# Patient Record
Sex: Male | Born: 1990 | Race: White | Hispanic: No | Marital: Single | Smoking: Current every day smoker
Health system: Southern US, Community
[De-identification: ages and names within clinical notes are randomized; demographics above are authoritative.]

## PROBLEM LIST (undated history)

## (undated) DIAGNOSIS — N4830 Priapism, unspecified: Secondary | ICD-10-CM

## (undated) HISTORY — PX: ABDOMINAL SURGERY: SHX537

---

## 2019-11-06 ENCOUNTER — Encounter (HOSPITAL_COMMUNITY): Payer: Self-pay | Admitting: Emergency Medicine

## 2019-11-06 ENCOUNTER — Emergency Department (HOSPITAL_COMMUNITY)
Admission: EM | Admit: 2019-11-06 | Discharge: 2019-11-08 | Disposition: A | Discharge status: Home / Self Care | Payer: Medicaid - Out of State | Attending: Emergency Medicine | Admitting: Emergency Medicine

## 2019-11-06 ENCOUNTER — Emergency Department (HOSPITAL_COMMUNITY): Payer: Medicaid - Out of State

## 2019-11-06 ENCOUNTER — Other Ambulatory Visit: Payer: Self-pay

## 2019-11-06 DIAGNOSIS — Z590 Homelessness: Secondary | ICD-10-CM | POA: Diagnosis not present

## 2019-11-06 DIAGNOSIS — F329 Major depressive disorder, single episode, unspecified: Secondary | ICD-10-CM | POA: Diagnosis present

## 2019-11-06 DIAGNOSIS — Z20828 Contact with and (suspected) exposure to other viral communicable diseases: Secondary | ICD-10-CM | POA: Insufficient documentation

## 2019-11-06 DIAGNOSIS — R45851 Suicidal ideations: Secondary | ICD-10-CM | POA: Insufficient documentation

## 2019-11-06 DIAGNOSIS — F332 Major depressive disorder, recurrent severe without psychotic features: Secondary | ICD-10-CM | POA: Insufficient documentation

## 2019-11-06 DIAGNOSIS — Z23 Encounter for immunization: Secondary | ICD-10-CM | POA: Insufficient documentation

## 2019-11-06 DIAGNOSIS — F17210 Nicotine dependence, cigarettes, uncomplicated: Secondary | ICD-10-CM | POA: Diagnosis not present

## 2019-11-06 HISTORY — DX: Priapism, unspecified: N48.30

## 2019-11-06 LAB — COMPREHENSIVE METABOLIC PANEL
ALT: 21 U/L (ref 0–44)
AST: 30 U/L (ref 15–41)
Albumin: 4 g/dL (ref 3.5–5.0)
Alkaline Phosphatase: 96 U/L (ref 38–126)
Anion gap: 12 (ref 5–15)
BUN: 16 mg/dL (ref 6–20)
CO2: 21 mmol/L — ABNORMAL LOW (ref 22–32)
Calcium: 9.2 mg/dL (ref 8.9–10.3)
Chloride: 100 mmol/L (ref 98–111)
Creatinine, Ser: 1.01 mg/dL (ref 0.61–1.24)
GFR calc Af Amer: >60 mL/min (ref >60)
GFR calc non Af Amer: >60 mL/min (ref >60)
Glucose, Bld: 109 mg/dL — ABNORMAL HIGH (ref 70–99)
Potassium: 4.1 mmol/L (ref 3.5–5.1)
Sodium: 133 mmol/L — ABNORMAL LOW (ref 135–145)
Total Bilirubin: 1.6 mg/dL — ABNORMAL HIGH (ref 0.3–1.2)
Total Protein: 6.7 g/dL (ref 6.5–8.1)

## 2019-11-06 LAB — CBC
HCT: 53 % — ABNORMAL HIGH (ref 39.0–52.0)
Hemoglobin: 18.4 g/dL — ABNORMAL HIGH (ref 13.0–17.0)
MCH: 32.8 pg (ref 26.0–34.0)
MCHC: 34.7 g/dL (ref 30.0–36.0)
MCV: 94.5 fL (ref 80.0–100.0)
Platelets: 248 10*3/uL (ref 150–400)
RBC: 5.61 MIL/uL (ref 4.22–5.81)
RDW: 13.3 % (ref 11.5–15.5)
WBC: 9.7 10*3/uL (ref 4.0–10.5)
nRBC: 0 % (ref 0.0–0.2)

## 2019-11-06 LAB — ETHANOL: Alcohol, Ethyl (B): <10 mg/dL (ref <10)

## 2019-11-06 LAB — ACETAMINOPHEN LEVEL: Acetaminophen (Tylenol), Serum: <10 ug/mL — ABNORMAL LOW (ref 10–30)

## 2019-11-06 LAB — SALICYLATE LEVEL: Salicylate Lvl: <7 mg/dL — ABNORMAL LOW (ref 7.0–30.0)

## 2019-11-06 MED ORDER — TETANUS-DIPHTH-ACELL PERTUSSIS 5-2.5-18.5 LF-MCG/0.5 IM SUSP
0.5000 mL | Freq: Once | INTRAMUSCULAR | Status: AC
  Administered 2019-11-06: 0.5 mL via INTRAMUSCULAR
  Filled 2019-11-06: qty 0.5

## 2019-11-06 NOTE — ED Notes (Signed)
TTS in progress 

## 2019-11-06 NOTE — ED Triage Notes (Signed)
Pt here for evaluation of suicidal ideation, under a lot of stress related to family problems, so he picked up some broken beer cans [place] the [place] and cut bilateral thighs. Pt sts she was trying to injure himself as much as possible so was repeatedly hitting himself in the chest and now has some chest wall pain. Pt supposed to be taking vistaril, but hasn't taken any in two months. From [place], here visiting/is homless d/t parents kicking him out d/t suicide attempts and being gay.

## 2019-11-06 NOTE — ED Provider Notes (Signed)
[facility] EMERGENCY DEPARTMENT Provider Note   CSN: [id number] Arrival date & time: 11/06/19  1636     History Chief Complaint  Patient presents with  . Suicidal    Devin Knight is a 28 y.o. male.  Patient presents to ED for evaluation of suicidal ideation. He recently had a argument with his mother, who kicked him out of the house due to admission of being gay and cross dressing. He has been homeless for two days. He states he picked up some glass from a broken beer bottle and attempted to cut his legs. He has very superficial wounds to the lower aspect of both thighs. He also endorses repeatedly hitting himself in the chest with his fists, and reports anterior chest wall pain.   Mental Health Problem Presenting symptoms: suicidal thoughts   Degree of incapacity (severity):  Moderate Onset quality:  Sudden Duration:  2 days Timing:  Constant Associated symptoms: feelings of worthlessness        Past Medical History:  Diagnosis Date  . Priapism     There are no problems to display for this patient.   Past Surgical History:  Procedure Laterality Date  . ABDOMINAL SURGERY         No family history on file.  Social History   Tobacco Use  . Smoking status: Current Every Day Smoker    Packs/day: 2.00    Types: Cigarettes  . Smokeless tobacco: Never Used  Substance Use Topics  . Alcohol use: Yes  . Drug use: Not on file    Home Medications Prior to Admission medications   Not on File    Allergies    Patient has no known allergies.  Review of Systems   Review of Systems  Skin: Positive for wound.  Psychiatric/Behavioral: Positive for suicidal ideas.  All other systems reviewed and are negative.   Physical Exam Updated Vital Signs BP (!) 144/95 (BP Location: Left Arm)   Pulse (!) 115   Temp 98 F (36.7 C) (Oral)   Resp 20   SpO2 97%   Physical Exam Constitutional:      Appearance: He is not ill-appearing.  HENT:   Head: Atraumatic.     Mouth/Throat:     Mouth: Mucous membranes are moist.  Eyes:     Conjunctiva/sclera: Conjunctivae normal.  Cardiovascular:     Rate and Rhythm: Normal rate and regular rhythm.  Pulmonary:     Effort: Pulmonary effort is normal.     Breath sounds: Normal breath sounds.  Abdominal:     Palpations: Abdomen is soft.  Musculoskeletal:        General: Normal range of motion.     Cervical back: Neck supple.  Skin:    General: Skin is warm and dry.  Neurological:     Mental Status: He is alert and oriented to person, place, and time.  Psychiatric:        Behavior: Behavior is cooperative.        Thought Content: Thought content includes suicidal ideation.        Cognition and Memory: Cognition and memory normal.     ED Results / Procedures / Treatments   Labs (all labs ordered are listed, but only abnormal results are displayed) Labs Reviewed  COMPREHENSIVE METABOLIC PANEL - Abnormal; Notable for the following components:      Result Value   Sodium 133 (*)    CO2 21 (*)    Glucose, Bld 109 (*)  Total Bilirubin 1.6 (*)    All other components within normal limits  SALICYLATE LEVEL - Abnormal; Notable for the following components:   Salicylate Lvl <7.0 (*)    All other components within normal limits  ACETAMINOPHEN LEVEL - Abnormal; Notable for the following components:   Acetaminophen (Tylenol), Serum <10 (*)    All other components within normal limits  CBC - Abnormal; Notable for the following components:   Hemoglobin 18.4 (*)    HCT 53.0 (*)    All other components within normal limits  SARS CORONAVIRUS 2 (TAT 6-24 HRS)  ETHANOL  RAPID URINE DRUG SCREEN, HOSP PERFORMED    EKG None  Radiology DG Chest 2 View  Result Date: 11/06/2019 CLINICAL DATA:  Chest wall/midsternal pain EXAM: CHEST - 2 VIEW COMPARISON:  None. FINDINGS: Some increased hazy interstitial opacity is seen towards the lung bases without focal consolidative airspace disease. No  convincing features of edema. Slight hyperinflation of the lungs with flattening of the diaphragms. No pneumothorax or effusion. The cardiomediastinal contours are unremarkable. No acute osseous or soft tissue abnormality. IMPRESSION: Increased hazy interstitial opacity towards the lung bases without focal consolidative airspace disease. Findings are favored to reflect atelectasis though early atypical infection could have a similar appearance. Electronically Signed   By: Kreg Shropshire M.D.   On: 11/06/2019 20:23    Procedures Procedures (including critical care time)  Medications Ordered in ED Medications - No data to display  ED Course  I have reviewed the triage vital signs and the nursing notes.  Pertinent labs & imaging results that were available during my care of the patient were reviewed by me and considered in my medical decision making (see chart for details).    MDM Rules/Calculators/A&P                      Labs and radiology results reviewed. Patient does not exhibit any respiratory symptoms. Doubt infection. Patient cleared for TTS consult for evaluation of suicidal thoughts. Patient has been placed in psych hold and moved to Rm 50 pending completion of consult. Final Clinical Impression(s) / ED Diagnoses Final diagnoses:  Suicidal ideation    Rx / DC Orders ED Discharge Orders    None       Felicie Morn, NP 11/06/19 2145    Mancel Bale, MD 11/09/19 [phone]

## 2019-11-06 NOTE — ED Notes (Signed)
Patient transported to X-ray 

## 2019-11-06 NOTE — ED Notes (Signed)
Pt belongings in locker 5.

## 2019-11-07 LAB — SARS CORONAVIRUS 2 (TAT 6-24 HRS): SARS Coronavirus 2: NEGATIVE

## 2019-11-07 LAB — RAPID URINE DRUG SCREEN, HOSP PERFORMED
Amphetamines: NOT DETECTED
Barbiturates: NOT DETECTED
Benzodiazepines: NOT DETECTED
Cocaine: NOT DETECTED
Opiates: NOT DETECTED
Tetrahydrocannabinol: POSITIVE — AB

## 2019-11-07 MED ORDER — LORAZEPAM 1 MG PO TABS
1.0000 mg | ORAL_TABLET | Freq: Four times a day (QID) | ORAL | Status: DC | PRN
  Administered 2019-11-07: 1 mg via ORAL
  Filled 2019-11-07: qty 1

## 2019-11-07 NOTE — ED Provider Notes (Signed)
Emergency Medicine Observation Re-evaluation Note  Devin Knight is a 28 y.o. male, seen on rounds today.  Pt initially presented to the ED for complaints of Suicidal Currently, the patient is resting comfortably. No complaints overnight.  Physical Exam  BP 126/77 (BP Location: Left Leg)   Pulse 85   Temp 98.1 F (36.7 C) (Oral)   Resp 18   SpO2 95%  Physical Exam  PE: Constitutional: well-developed, well-nourished, no apparent distress HENT: normocephalic, atraumatic.  Cardiovascular: normal rate and rhythm Pulmonary/Chest: effort normal; breath sounds clear and equal bilaterally; no wheezes or rales Abdominal: non distended Musculoskeletal: full ROM, no edema Neurological: alert  Skin: warm and dry, no rash, no diaphoresis Psychiatric: + suicidal ideations and auditory hallucinations   ED Course / MDM  EKG:    I have reviewed the labs performed to date as well as medications administered while in observation.  Recent changes in the last 24 hours include none. Plan  Current plan is for inpatient psych treatment. Patient is not under full IVC at this time.   Flint Melter 11/07/19 1908    Drenda Freeze, MD 11/08/19 0800

## 2019-11-07 NOTE — ED Notes (Signed)
Patient was Given a snack and drink. A Regular Diet was ordered for Lunch.

## 2019-11-07 NOTE — ED Notes (Signed)
Called to pts room, pt states that he is hearing voices and he is talking to himself. Pt asking if he can have something for anxiety and for sleep tonight. Will speak with the md.

## 2019-11-07 NOTE — ED Notes (Signed)
The pt is asking for his medicines  He has none ordered  Nothing is listed in his current chart

## 2019-11-07 NOTE — ED Notes (Addendum)
Pt to purple zone, dressed in purple scrubs. Pt c/o pain to his chest from hitting it with his fists. "I think I broke my ribs". Pt in no distress. Resp even and non-labored. Pt aware that we need a urine sample.

## 2019-11-07 NOTE — ED Notes (Signed)
Pt given snack and drink 

## 2019-11-07 NOTE — ED Notes (Signed)
Breakfast tray ordered 

## 2019-11-07 NOTE — BH Assessment (Signed)
[facility] Assessment Progress Note   Clinician informed Dr. Gustavus Messing of patient's disposition (inpatient).  He will let PA Quincy Carnes know.

## 2019-11-07 NOTE — ED Notes (Signed)
Called nutrition @1900  and they stated this patient received dinner tray at 1815

## 2019-11-07 NOTE — BHH Counselor (Signed)
Pt was reassessed this AM.  He reported that he continues to feel badly, stating that he is suicidal (currently without fixed plan -- ''whatever I can find'') and auditory hallucinations (voices encouraging him to kill himself and others).  Noted to client that he was interested in seeking treatment in [place].  Client stated that he had treatment in [place], but he is interested in getting treatment in [place] first.  He stated that if discharged, he might harm himself.  Pt stated also that once he is discharged, he will move to [place].  He stated that he has support in the community who can transport him there.  Client declined an opportunity to leave the hospital and go to [place] now.  Pt continues to meet inpatient criteria.

## 2019-11-07 NOTE — BH Assessment (Signed)
Tele Assessment Note   Patient Name: Devin Knight MRN: [id number] Referring Physician: Felicie Morn, NP Location of Patient: [facility] Location of Provider: Behavioral Health TTS Department  Tanya Marvin is an 28 y.o. male.  -Clinician reviewed note by Felicie Morn, NP.  Patient presents to ED for evaluation of suicidal ideation. He recently had a argument with his mother, who kicked him out of the house due to admission of being gay and cross dressing. He has been homeless for two days. He states he picked up some glass from a broken beer bottle and attempted to cut his legs. He has very superficial wounds to the lower aspect of both thighs. He also endorses repeatedly hitting himself in the chest with his fists, and reports anterior chest wall pain.  Patient said that he had come out as gay to his mother and two sisters.  He said that mother told him to get out of the house.  Patient says that he has been essentially homeless for the last 2-4 days.  At first he had said it was 2 days then it was 2-4 days.  His sisters do not return calls and he has not tried to call his mother back.  Patient says he broke his phone.    Patient did self harm by cutting his legs and hitting himself on the chest repeatedly.  Patient endorses SI with a plan to cut himself.  Patient has had two previous suicide attempts.  Pt says he has had some thoughts of harm to others but no HI.  He is not specific about wanting to harm other people.  No hx of getting into fights (per patient).    Patient says he hears voices telling him to kill himself.  Patient sees things and only said that he saw "couds and light."  He denies any use of illicit drugs and says he had a beer a few days ago.  Patient says that he lives primarily in [place].  His father died about a year ago.  Pt is unclear about how long he has been staying with his mother in [place].  He says that he had a job in [place] and friends there.    Patient identifies as  both male and male.  He does say he cross dresses.  He prefers to be called [name] or [name]."  Patient is alert and oriented x3.  He has fair eye contact.  His demeanor is congruent w/ being depressed.  Pt does not show signs of responding to internal stimuli at this time.  Pt thought process is relevant.  He is getting <4H/D of sleep due to homelessness.  Pt reports his appetite is good.  Patient says he has had inpatient care in [place] at a facility in [facility]. [place].  He has no current outpatient care.  -Clinician discussed patient care with Renaye Rakers, NP who recommended inpatient care.  AC Fransico Michael said that [facility] has no appropriate beds at this time.  TTS to seek placement.  Diagnosis: F33.2 MDD recurrent, severe  Past Medical History:  Past Medical History:  Diagnosis Date  . Priapism     Past Surgical History:  Procedure Laterality Date  . ABDOMINAL SURGERY      Family History: No family history on file.  Social History:  reports that he has been smoking cigarettes. He has been smoking about 2.00 packs per day. He has never used smokeless tobacco. He reports current alcohol use. No history on file for drug.  Additional Social History:  Alcohol / Drug Use Pain Medications: None Prescriptions: None Over the Counter: None History of alcohol / drug use?: No history of alcohol / drug abuse  CIWA: CIWA-Ar BP: 136/61 Pulse Rate: (!) 101 COWS:    Allergies: No Known Allergies  Home Medications: (Not in a hospital admission)   OB/GYN Status:  No LMP for male patient.  General Assessment Data [facility] Assessment: In system Is this a Tele or Face-to-Face Assessment?: Tele Assessment Is this an Initial Assessment or a Re-assessment for this encounter?: Initial Assessment Patient Accompanied by:: N/A Language Other than English: No Living Arrangements: Homeless/Shelter(Pt homeless for last two to four days.) What gender do you identify  as?: Male(Identifies as both.) Marital status: Single Pregnancy Status: No Living Arrangements: Other (Comment)(Homeless last 2-4 days) Can pt return to current living arrangement?: No(Pt put out of his mother's home.) Admission Status: Voluntary Is patient capable of signing voluntary admission?: Yes Referral Source: Self/Family/Friend Insurance type: MCD out of state     Crisis Care Plan Living Arrangements: Other (Comment)(Homeless last 2-4 days) Name of Psychiatrist: None Name of Therapist: None  Education Status Is patient currently in school?: No Is the patient employed, unemployed or receiving disability?: Unemployed  Risk to self with the past 6 months Suicidal Ideation: Yes-Currently Present Has patient been a risk to self within the past 6 months prior to admission? : Yes Suicidal Intent: Yes-Currently Present Has patient had any suicidal intent within the past 6 months prior to admission? : Yes Is patient at risk for suicide?: Yes Suicidal Plan?: Yes-Currently Present Has patient had any suicidal plan within the past 6 months prior to admission? : No Specify Current Suicidal Plan: Cutting himself Access to Means: Yes Specify Access to Suicidal Means: Broken glass What has been your use of drugs/alcohol within the last 12 months?: N/A Previous Attempts/Gestures: Yes How many times?: 2 Other Self Harm Risks: Yes Triggers for Past Attempts: Family contact Intentional Self Injurious Behavior: Bruising(Hitting himself on the chest.) Comment - Self Injurious Behavior: Hitting himself on the chest. Family Suicide History: No Recent stressful life event(s): Conflict (Comment)(Coming out as gay to mother and sisters) Persecutory voices/beliefs?: Yes Depression: Yes Depression Symptoms: Despondent, Guilt, Loss of interest in usual pleasures, Feeling worthless/self pity, Insomnia, Tearfulness, Isolating Substance abuse history and/or treatment for substance abuse?:  No Suicide prevention information given to non-admitted patients: Not applicable  Risk to Others within the past 6 months Homicidal Ideation: No Does patient have any lifetime risk of violence toward others beyond the six months prior to admission? : No Thoughts of Harm to Others: Yes-Currently Present Comment - Thoughts of Harm to Others: No one in particular Current Homicidal Intent: No Current Homicidal Plan: No Access to Homicidal Means: No Identified Victim: No one History of harm to others?: No Assessment of Violence: None Noted Violent Behavior Description: None reported Does patient have access to weapons?: No Criminal Charges Pending?: No Does patient have a court date: No Is patient on probation?: No  Psychosis Hallucinations: Auditory, Visual(Voices of self harm; seeing light and clouds) Delusions: None noted  Mental Status Report Appearance/Hygiene: Disheveled, In scrubs Eye Contact: Good Motor Activity: Freedom of movement, Unremarkable Speech: Logical/coherent Level of Consciousness: Alert Mood: Depressed, Anxious, Sad Affect: Depressed Anxiety Level: Moderate Thought Processes: Coherent, Relevant Judgement: Partial Orientation: Person, Place, Situation Obsessive Compulsive Thoughts/Behaviors: None  Cognitive Functioning Concentration: Normal Memory: Recent Intact, Remote Intact Is patient IDD: No Insight: Fair Impulse  Control: Poor Appetite: Good Have you had any weight changes? : No Change Sleep: Decreased Total Hours of Sleep: (<4H/D) Vegetative Symptoms: None  ADLScreening [facility] Assessment Services) Patient's cognitive ability adequate to safely complete daily activities?: Yes Patient able to express need for assistance with ADLs?: Yes Independently performs ADLs?: Yes (appropriate for developmental age)  Prior Inpatient Therapy Prior Inpatient Therapy: Yes Prior Therapy Dates: A few years ago Prior Therapy Facilty/Provider(s): [facility] in [place] Reason for Treatment: SI  Prior Outpatient Therapy Prior Outpatient Therapy: Yes Prior Therapy Dates: past care Prior Therapy Facilty/Provider(s): Providers in [place] Reason for Treatment: med management & counseling Does patient have an ACCT team?: No Does patient have Intensive In-House Services?  : No Does patient have [facility] services? : No Does patient have P4CC services?: No  ADL Screening (condition at time of admission) Patient's cognitive ability adequate to safely complete daily activities?: Yes Is the patient deaf or have difficulty hearing?: No Does the patient have difficulty seeing, even when wearing glasses/contacts?: No Does the patient have difficulty concentrating, remembering, or making decisions?: Yes Patient able to express need for assistance with ADLs?: Yes Does the patient have difficulty dressing or bathing?: No Independently performs ADLs?: Yes (appropriate for developmental age) Does the patient have difficulty walking or climbing stairs?: No Weakness of Legs: None       Abuse/Neglect Assessment (Assessment to be complete while patient is alone) Abuse/Neglect Assessment Can Be Completed: Yes Physical Abuse: Denies Verbal Abuse: Yes, present (Comment) Sexual Abuse: Denies Exploitation of patient/patient's resources: Denies Self-Neglect: Denies     Regulatory affairs officer (For Healthcare) Does Patient Have a Medical Advance Directive?: No Would patient like information on creating a medical advance directive?: No - Patient declined          Disposition:  Disposition Initial Assessment Completed for this Encounter: Yes Patient referred to: Other (Comment)(TTS to refer out.  No appropriate [facility] beds)  This service was provided via telemedicine using a 2-way, interactive audio and video technology.  Names of all persons participating in this telemedicine service and their role in this encounter. Name: Myra Rude Role:  patient  Name: Curlene Dolphin, M.S. LCAS QP Role: clinician  Name:  Role:   Name:  Role:     Raymondo Band 11/07/2019 12:04 AM

## 2019-11-07 NOTE — ED Notes (Signed)
Breakfast ordered 

## 2019-11-07 NOTE — ED Notes (Signed)
The pt is asking for medicine  None is ordered  Will check with the edp

## 2019-11-07 NOTE — ED Notes (Signed)
Per Beverely Low at [facility], pt meets inpatient treatment, but they have no beds. Will seek placement.

## 2019-11-08 MED ORDER — LORAZEPAM 1 MG PO TABS
1.0000 mg | ORAL_TABLET | Freq: Once | ORAL | Status: AC
  Administered 2019-11-08: 08:00:00 1 mg via ORAL
  Filled 2019-11-08: qty 1

## 2019-11-08 NOTE — ED Notes (Signed)
RN CALLED [facility]

## 2019-11-08 NOTE — ED Notes (Addendum)
Breakfast tray ordered 

## 2019-11-08 NOTE — ED Notes (Signed)
Received a call from Alphonzo Dublin at [facility] in [place]. States that he just needs the doctors notes faxed to him and that he has everything else. States they have an open bed there and one he gets the notes he will review them and get back with the dayshift RN. Notes faxed to Shanon Brow at [phone]. His direct line is [phone].

## 2019-11-08 NOTE — ED Notes (Addendum)
Accepting info: Dr. Ezekiel Slocumb Report: [phone]  Amaryllis Dyke, NP confirming bed [phone] BED IS READY AT [place]. [facility]

## 2019-11-08 NOTE — ED Notes (Signed)
REPORT GIVEN TO JENNY

## 2019-11-08 NOTE — ED Notes (Signed)
RN ATTEMPTED REPORT TWICE

## 2019-11-08 NOTE — ED Notes (Signed)
Report called to David

## 2020-12-09 IMAGING — CR DG CHEST 2V
2 series · 2 of 2 positions shown · non-contrast
Comparison: None.

CLINICAL DATA: Chest wall/midsternal pain

EXAM:
CHEST - 2 VIEW

[chest pa]
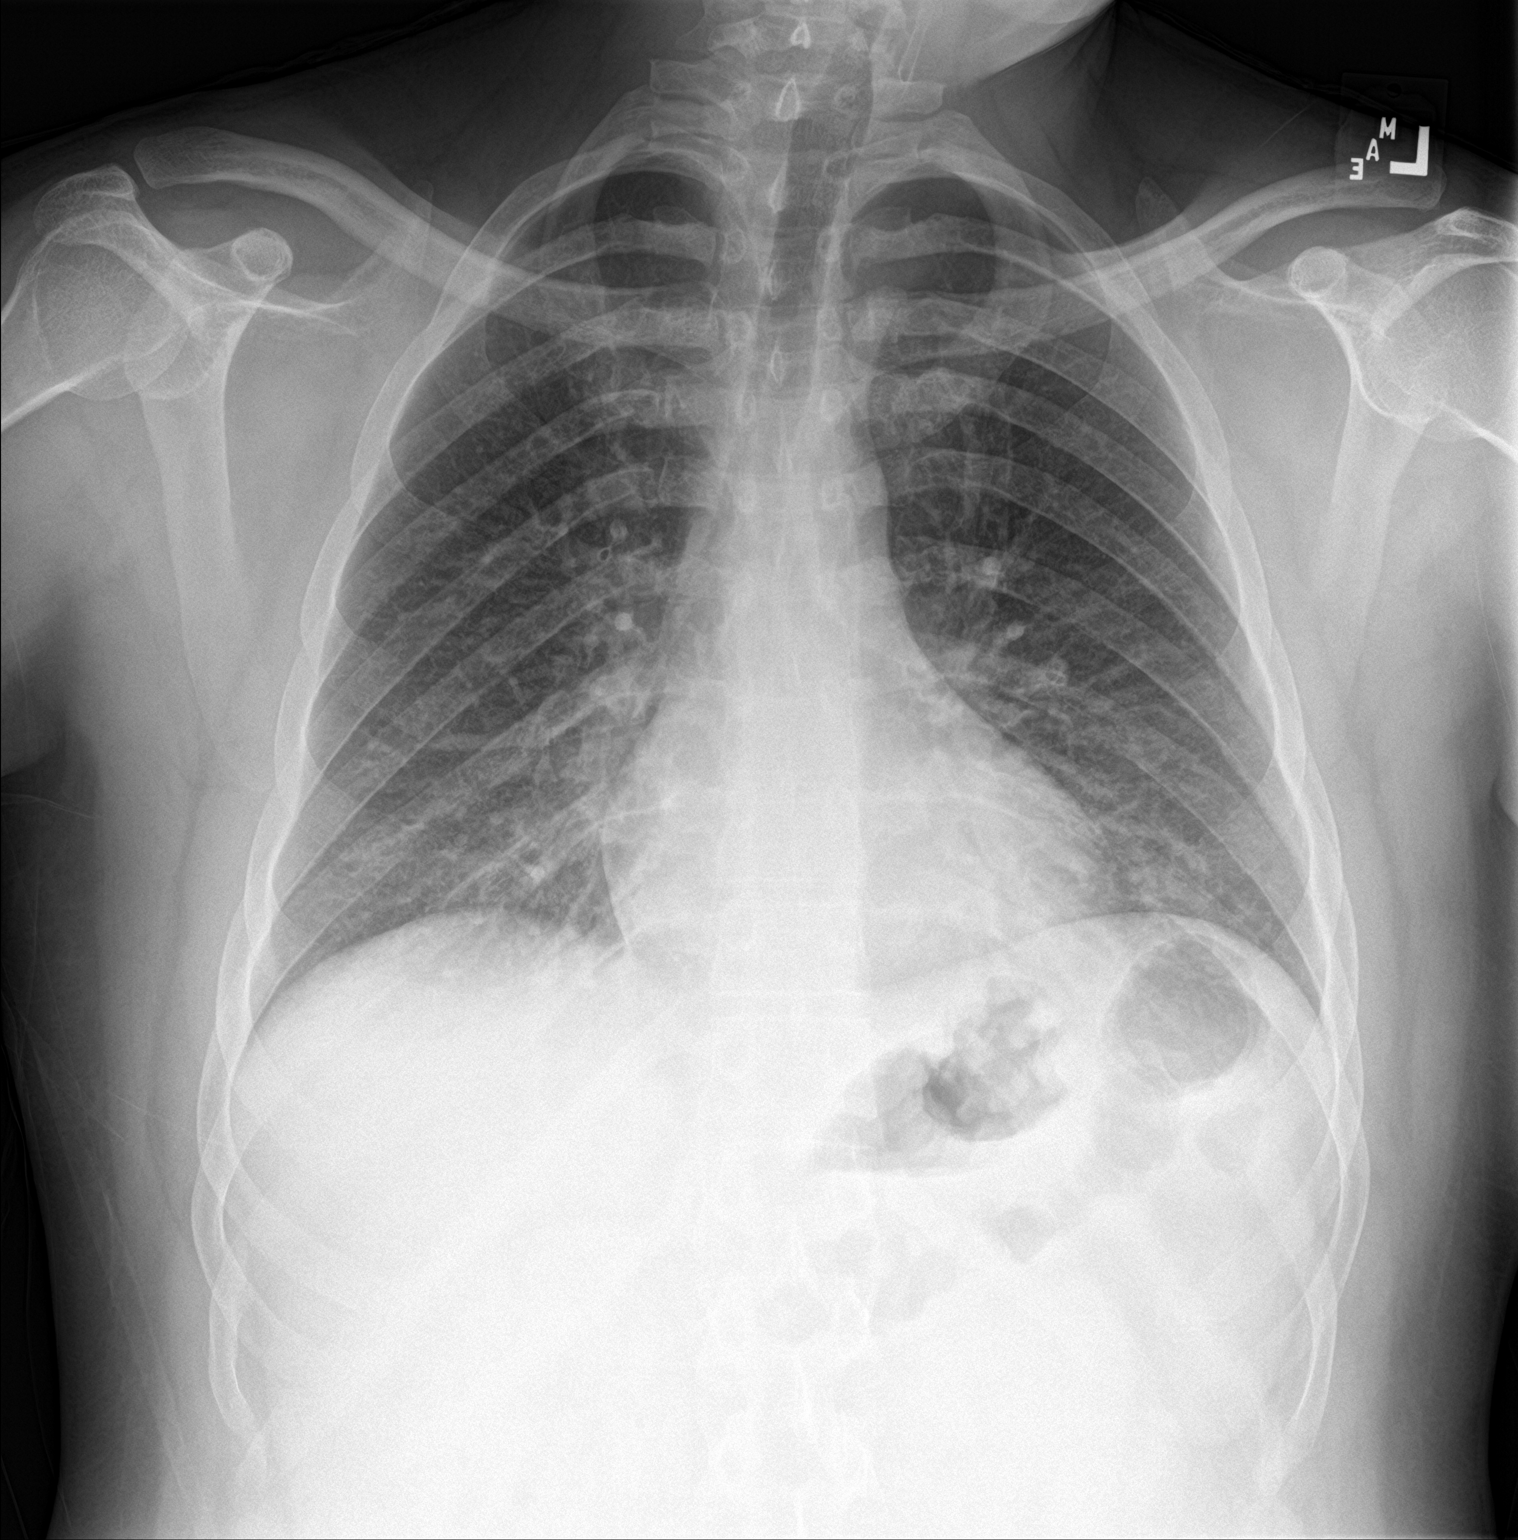

[chest lat]
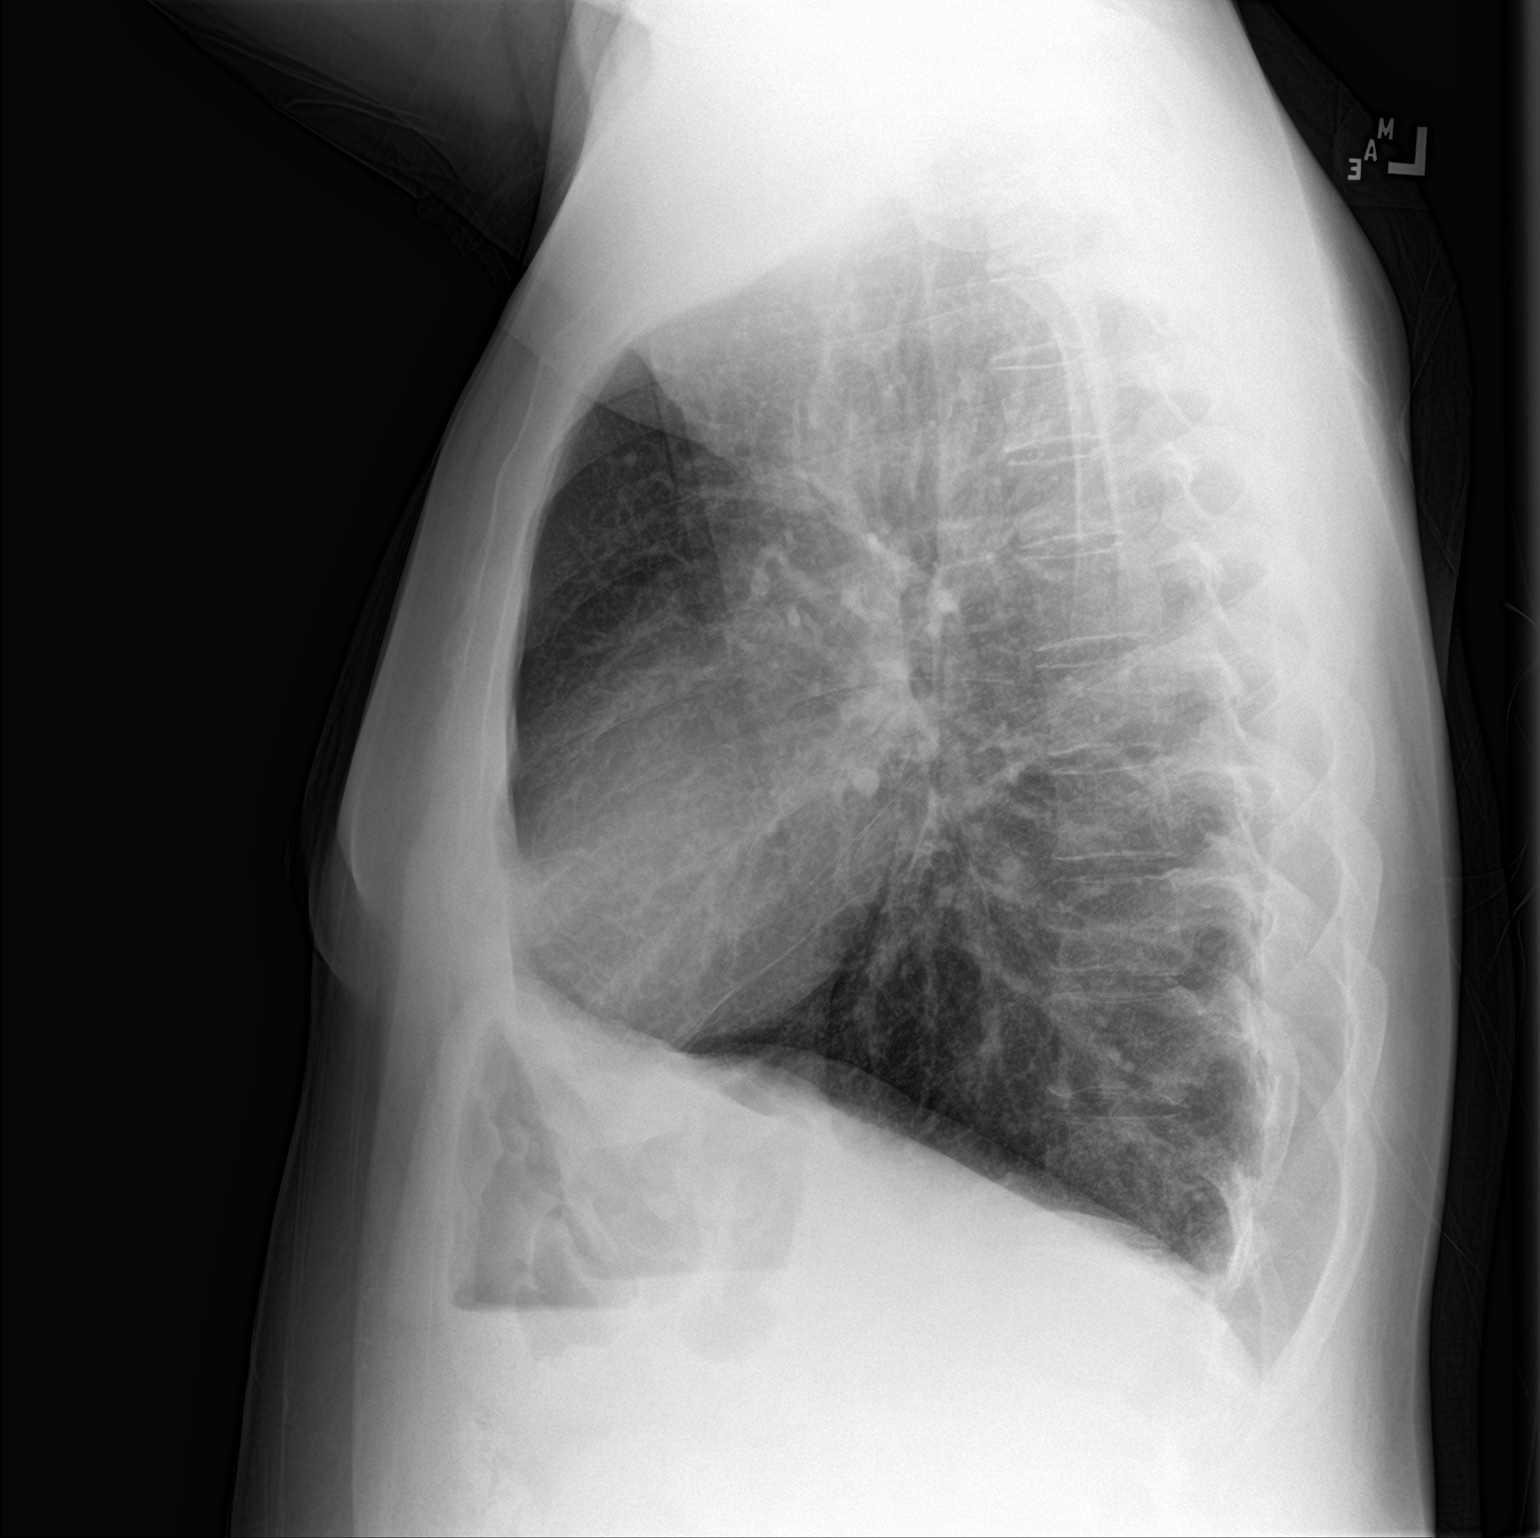

[2 of 2 positions shown; findings below may reference images not displayed]

FINDINGS: Some increased hazy interstitial opacity is seen towards the lung
bases without focal consolidative airspace disease. No convincing
features of edema. Slight hyperinflation of the lungs with
flattening of the diaphragms. No pneumothorax or effusion. The
cardiomediastinal contours are unremarkable. No acute osseous or
soft tissue abnormality.
IMPRESSION: Increased hazy interstitial opacity towards the lung bases without
focal consolidative airspace disease. Findings are favored to
reflect atelectasis though early atypical infection could have a
similar appearance.
